# Patient Record
Sex: Female | Born: 1970 | Race: Black or African American | Hispanic: No | Marital: Single | Smoking: Never smoker
Health system: Southern US, Community
[De-identification: ages and names within clinical notes are randomized; demographics above are authoritative.]

## PROBLEM LIST (undated history)

## (undated) DIAGNOSIS — I10 Essential (primary) hypertension: Secondary | ICD-10-CM

## (undated) HISTORY — PX: HERNIA REPAIR: SHX51

---

## 2009-08-16 ENCOUNTER — Emergency Department (HOSPITAL_COMMUNITY): Admission: EM | Admit: 2009-08-16 | Discharge: 2009-08-16 | Payer: Self-pay | Admitting: Emergency Medicine

## 2009-08-18 ENCOUNTER — Emergency Department (HOSPITAL_COMMUNITY): Admission: EM | Admit: 2009-08-18 | Discharge: 2009-08-18 | Payer: Self-pay | Admitting: Family Medicine

## 2009-08-23 ENCOUNTER — Emergency Department (HOSPITAL_COMMUNITY): Admission: EM | Admit: 2009-08-23 | Discharge: 2009-08-23 | Payer: Self-pay | Admitting: Family Medicine

## 2013-07-15 ENCOUNTER — Encounter (HOSPITAL_COMMUNITY): Payer: Self-pay | Admitting: *Deleted

## 2013-07-15 ENCOUNTER — Emergency Department (HOSPITAL_COMMUNITY): Payer: BC Managed Care – PPO

## 2013-07-15 ENCOUNTER — Emergency Department (HOSPITAL_COMMUNITY)
Admission: EM | Admit: 2013-07-15 | Discharge: 2013-07-15 | Disposition: A | Discharge status: Home / Self Care | Payer: BC Managed Care – PPO | Attending: Emergency Medicine | Admitting: Emergency Medicine

## 2013-07-15 DIAGNOSIS — R0789 Other chest pain: Secondary | ICD-10-CM

## 2013-07-15 DIAGNOSIS — I10 Essential (primary) hypertension: Secondary | ICD-10-CM | POA: Insufficient documentation

## 2013-07-15 DIAGNOSIS — R071 Chest pain on breathing: Secondary | ICD-10-CM | POA: Insufficient documentation

## 2013-07-15 HISTORY — DX: Essential (primary) hypertension: I10

## 2013-07-15 LAB — POCT I-STAT, CHEM 8
Calcium, Ion: 1.2 mmol/L (ref 1.12–1.23)
Chloride: 105 mEq/L (ref 96–112)
Glucose, Bld: 153 mg/dL — ABNORMAL HIGH (ref 70–99)
HCT: 33 % — ABNORMAL LOW (ref 36.0–46.0)
Sodium: 139 mEq/L (ref 135–145)
TCO2: 25 mmol/L (ref 0–100)

## 2013-07-15 LAB — CBC
HCT: 31.7 % — ABNORMAL LOW (ref 36.0–46.0)
MCH: 24.1 pg — ABNORMAL LOW (ref 26.0–34.0)
MCHC: 32.5 g/dL (ref 30.0–36.0)
MCV: 74.1 fL — ABNORMAL LOW (ref 78.0–100.0)
Platelets: 250 10*3/uL (ref 150–400)
RDW: 16.6 % — ABNORMAL HIGH (ref 11.5–15.5)
WBC: 5.8 10*3/uL (ref 4.0–10.5)

## 2013-07-15 LAB — POCT I-STAT TROPONIN I: Troponin i, poc: 0 ng/mL (ref 0.00–0.08)

## 2013-07-15 NOTE — ED Provider Notes (Signed)
CSN: [id number]     Arrival date & time 07/15/13  2021 History   First MD Initiated Contact with Patient 07/15/13 2101     Chief Complaint  Patient presents with  . Anxiety  . Chest Pain   (Consider location/radiation/quality/duration/timing/severity/associated sxs/prior Treatment) Patient is a 42 y.o. female presenting with chest pain.  Chest Pain Chest pain location: left and right parasternal borders. Pain quality: aching (and squeezing)   Pain radiates to:  Does not radiate Pain radiates to the back: no   Pain severity:  Mild Onset quality:  Gradual Timing:  Intermittent Progression:  Resolved Chronicity:  New Context: at rest   Relieved by: position changes. Worsened by:  Movement and certain positions Ineffective treatments:  None tried Associated symptoms: no abdominal pain, no anorexia, no anxiety, no back pain, no cough, no fever, no headache, no nausea, no numbness, no orthopnea, no palpitations, no shortness of breath, not vomiting and no weakness   Risk factors: hypertension and obesity   Risk factors: no coronary artery disease, no diabetes mellitus, no high cholesterol, no prior DVT/PE, no smoking and no surgery     Past Medical History  Diagnosis Date  . Hypertension    History reviewed. No pertinent past surgical history. No family history on file. History  Substance Use Topics  . Smoking status: Never Smoker   . Smokeless tobacco: Not on file  . Alcohol Use: Not on file   OB History   Grav Para Term Preterm Abortions TAB SAB Ect Mult Living                 Review of Systems  Constitutional: Negative for fever.  HENT: Negative for congestion and sore throat.   Respiratory: Positive for chest tightness. Negative for cough and shortness of breath.   Cardiovascular: Positive for chest pain. Negative for palpitations and orthopnea.  Gastrointestinal: Negative for nausea, vomiting, abdominal pain, diarrhea, constipation and anorexia.  Musculoskeletal:  Negative for myalgias and back pain.  Skin: Negative for color change, pallor, rash and wound.  Neurological: Negative for weakness, numbness and headaches.  All other systems reviewed and are negative.    Allergies  Review of patient's allergies indicates no known allergies.  Home Medications  No current outpatient prescriptions on file. BP 135/89  Pulse 79  Temp(Src) 98.8 F (37.1 C) (Oral)  SpO2 98%  LMP 06/26/2013 Physical Exam  Nursing note and vitals reviewed. Constitutional: She is oriented to person, place, and time. She appears well-developed and well-nourished. No distress.  Pleasant, well appearing female in good spirits and NAD.  HENT:  Head: Normocephalic and atraumatic.  Mouth/Throat: Oropharynx is clear and moist. No oropharyngeal exudate.  Eyes: Conjunctivae and EOM are normal. Pupils are equal, round, and reactive to light.  Neck: Normal range of motion. Neck supple.  Cardiovascular: Normal rate, regular rhythm and normal heart sounds.  Exam reveals no gallop and no friction rub.   No murmur heard. Pulmonary/Chest: Effort normal and breath sounds normal. No respiratory distress. She has no wheezes. She has no rales. She exhibits no tenderness.  Abdominal: Soft. She exhibits no distension. There is no tenderness.  obese  Musculoskeletal: Normal range of motion. She exhibits no edema and no tenderness.  Lymphadenopathy:    She has no cervical adenopathy.  Neurological: She is alert and oriented to person, place, and time.  Skin: Skin is warm and dry. No rash noted. She is not diaphoretic.  Psychiatric: She has a normal mood and affect. Her  behavior is normal. Judgment and thought content normal.    ED Course  Procedures (including critical care time) Labs Review Labs Reviewed  CBC - Abnormal; Notable for the following:    Hemoglobin 10.3 (*)    HCT 31.7 (*)    MCV 74.1 (*)    MCH 24.1 (*)    RDW 16.6 (*)    All other components within normal limits   POCT I-STAT, CHEM 8 - Abnormal; Notable for the following:    Glucose, Bld 153 (*)    Hemoglobin 11.2 (*)    HCT 33.0 (*)    All other components within normal limits  POCT I-STAT TROPONIN I   Imaging Review Dg Chest 2 View  07/15/2013   *RADIOLOGY REPORT*  Clinical Data: Anxiety and chest pain.  CHEST - 2 VIEW  Comparison: None.  Findings: The lungs are well-aerated and clear.  There is no evidence of focal opacification, pleural effusion or pneumothorax.  The heart is normal in size; the mediastinal contour is within normal limits.  No acute osseous abnormalities are seen.  IMPRESSION: No acute cardiopulmonary process seen.   Original Report Authenticated By: Tonia Ghent, M.D.    MDM   1. Chest wall discomfort     Patient is a 42 year old female with a history of hypertension he presents with 2 episodes of mild chest pain or lightheadedness. Last night will she was laying in bed, she had some positional chest pain noted parasternally on both sides of her chest. It was mild in nature and only lasted less than a half hour. It waxed and waned with position and improved with certain movements and sitting up. Non-exertional. No lower extremity swelling or dyspnea. She had no associated fever, cough, shortness of breath, nausea, vomiting, weakness numbness, tingling. Today, while on the phone with her friend who is an EMT, she had similar episode with some associated anxiety.  On arrival patient is asymptomatic, pleasant, well-appearing female. She states no chest pain. Afebrile and hemodynamically stable. Chest x-ray without consolidation, effusion, pneumothorax, or widened mediastinum. EKG normal sinus rhythm as documented above. Initial troponin as well as basic labs obtained in triage are negative. Until this patient is low risk for ACS based on history as well as physical exam. TIMI 0 and HEART score 1. PERC negative and doubt PE. Low risk and atypical story for dissection. More likely is  costochondritis, anxiety. Will discharge home with course of anti-inflammatories as needed. Will need PCP followup for further management of her hypertension as well as further cardiac risk stratification. Based on very atypical history and low risk stratification, patient stable for discharge.    Date: 07/15/2013  Rate: 84  Rhythm: normal sinus rhythm  QRS Axis: normal  Intervals: normal  ST/T Wave abnormalities: normal  Conduction Disutrbances:none  Narrative Interpretation:   Old EKG Reviewed: none available  Discussed with the patient return precautions and need for follow up with PCP. Patient voiced understanding. Stable for d/c. This patient was discussed with my attending, Dr. Redgie Grayer.     Dorna Leitz, MD 07/15/13 [phone]

## 2013-07-15 NOTE — ED Notes (Signed)
MD at bedside. 

## 2013-07-15 NOTE — ED Notes (Signed)
pT STATES SHE HAD SOME cp LAST NIGHT WHILE TRYING TO SLEEP THAT WAS RELEIVED BY SITTING UP, AND NOT STATES SHE IS HAVING SOME CHEST TENDERNESS, BUT THINKS IT MIGHT BE RELATED TO ANXIETY MORE THAN cp

## 2013-07-16 NOTE — ED Provider Notes (Signed)
I saw and evaluated the patient, reviewed the resident's note and I agree with the findings and plan.  I have reviewed EKG and agree with resident interpretation.    Low risk CP.  ER w/u negative.    Darlys Gales, MD 07/16/13 [phone]

## 2015-02-14 IMAGING — CR DG CHEST 2V
2 series · 2 of 2 positions shown · non-contrast
Comparison: None.

CLINICAL DATA: Anxiety and chest pain.

CHEST - 2 VIEW

[w chest pa]
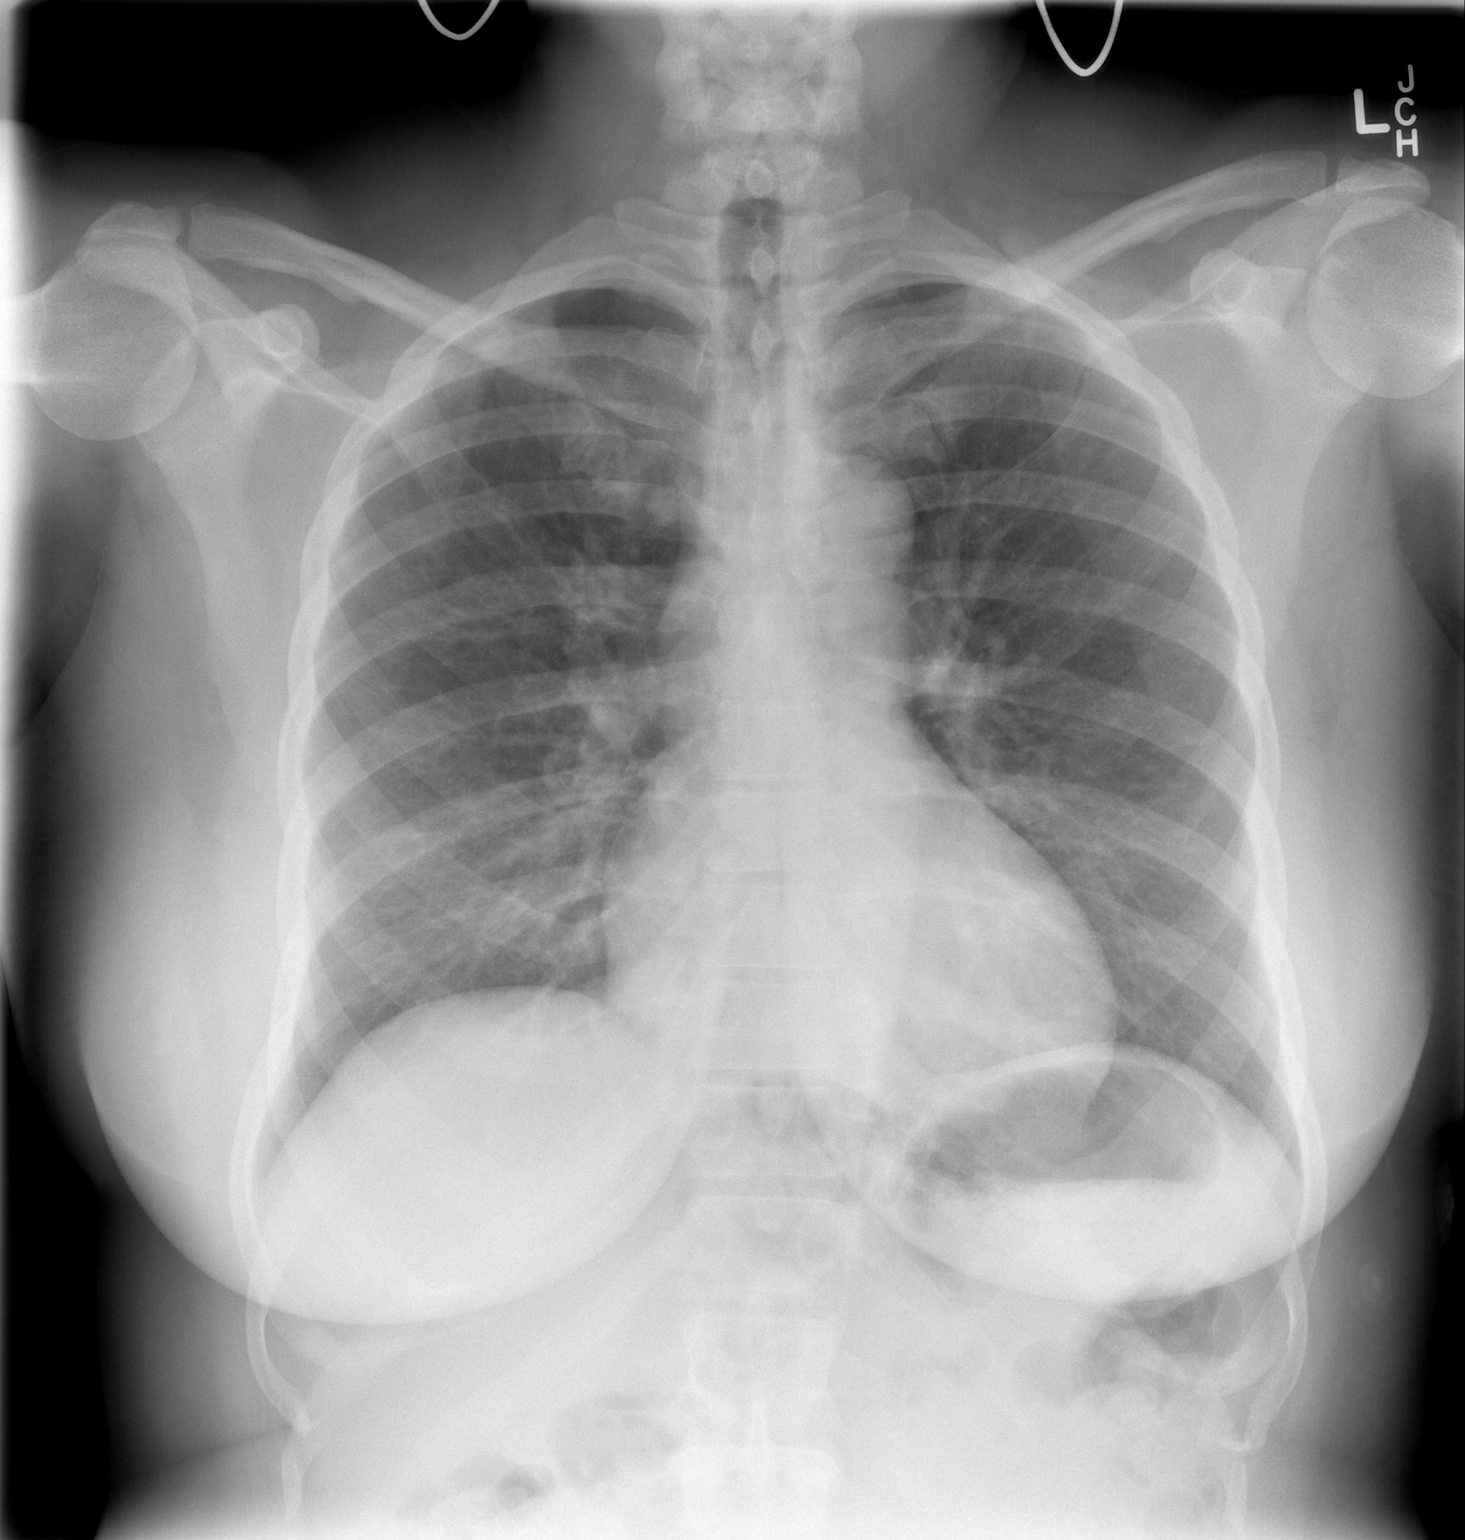

[w chest lat]
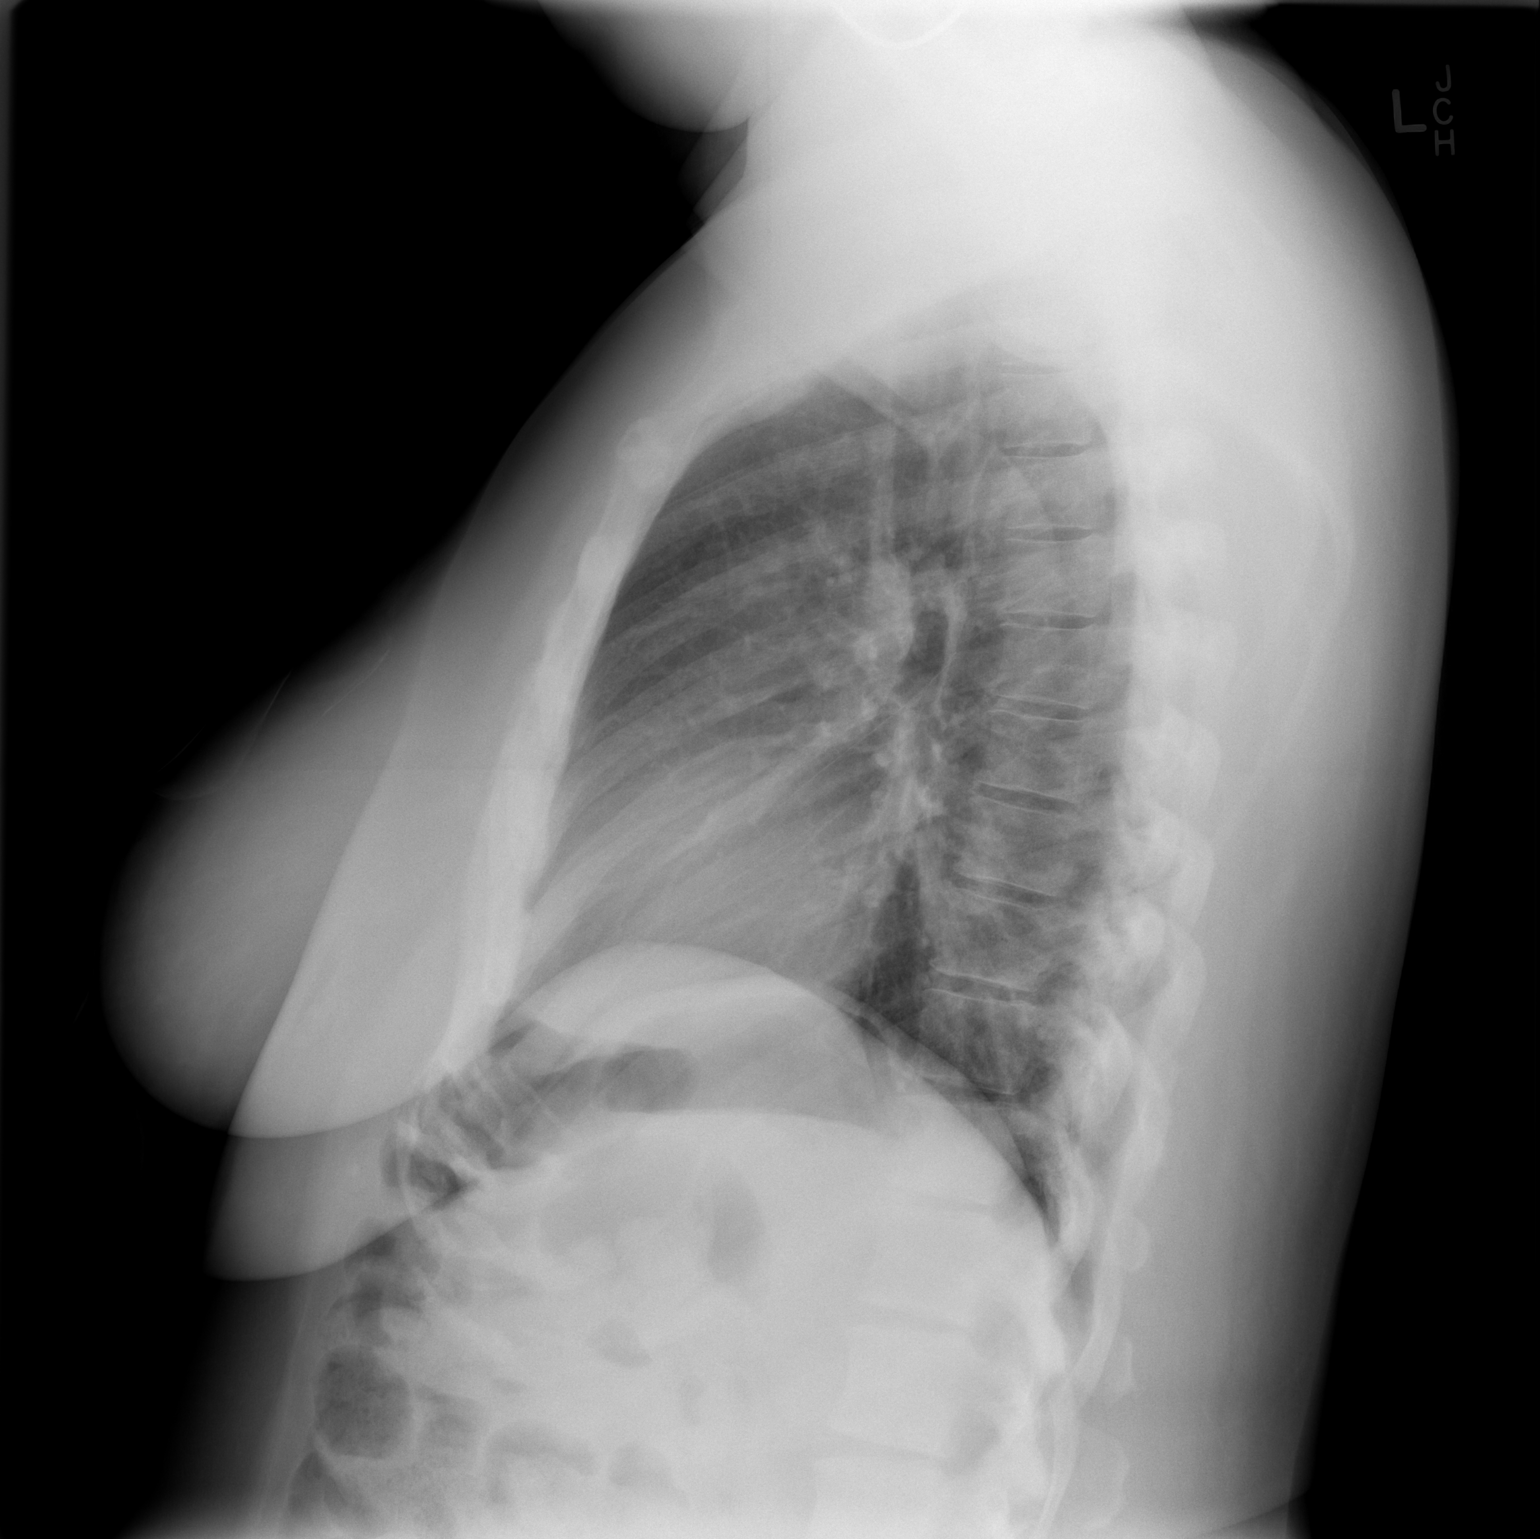

[2 of 2 positions shown; findings below may reference images not displayed]

FINDINGS: The lungs are well-aerated and clear.  There is no
evidence of focal opacification, pleural effusion or pneumothorax.

The heart is normal in size; the mediastinal contour is within
normal limits.  No acute osseous abnormalities are seen.
IMPRESSION: No acute cardiopulmonary process seen.

## 2022-09-05 ENCOUNTER — Ambulatory Visit (HOSPITAL_COMMUNITY)
Admission: EM | Admit: 2022-09-05 | Discharge: 2022-09-05 | Disposition: A | Discharge status: Home / Self Care | Payer: BLUE CROSS/BLUE SHIELD | Attending: Physician Assistant | Admitting: Physician Assistant

## 2022-09-05 ENCOUNTER — Encounter (HOSPITAL_COMMUNITY): Payer: Self-pay | Admitting: Emergency Medicine

## 2022-09-05 DIAGNOSIS — T7840XA Allergy, unspecified, initial encounter: Secondary | ICD-10-CM | POA: Diagnosis not present

## 2022-09-05 MED ORDER — METHYLPREDNISOLONE SODIUM SUCC 125 MG IJ SOLR
60.0000 mg | Freq: Once | INTRAMUSCULAR | Status: AC
  Administered 2022-09-05: 60 mg via INTRAMUSCULAR

## 2022-09-05 MED ORDER — METHYLPREDNISOLONE SODIUM SUCC 125 MG IJ SOLR
INTRAMUSCULAR | Status: AC
  Filled 2022-09-05: qty 2

## 2022-09-05 NOTE — ED Provider Notes (Signed)
[facility]    CSN: [id number] Arrival date & time: 09/05/22  1646      History   Chief Complaint Chief Complaint  Patient presents with   Urticaria   Allergic Reaction    HPI Kathryn Russell is a 51 y.o. female.   Pt reports earlier today after eating springs rolls with shrimp salmon, peanut sauce she started today experience hives, scratchy throat.  She reports she has eaten these foods in the past without issues.  She has no known allergies.  She reports taking benadryl, rash has now subsided.  Denies shortness of breath or trouble breathing or swallowing.  She feels like her voice is hoarse.     Past Medical History:  Diagnosis Date   Hypertension     There are no problems to display for this patient.   Past Surgical History:  Procedure Laterality Date   HERNIA REPAIR      OB History   No obstetric history on file.      Home Medications    Prior to Admission medications   Not on File    Family History No family history on file.  Social History Social History   Tobacco Use   Smoking status: Never     Allergies   Patient has no known allergies.   Review of Systems Review of Systems  Constitutional:  Negative for chills and fever.  HENT:  Positive for voice change. Negative for ear pain and sore throat.   Eyes:  Negative for pain and visual disturbance.  Respiratory:  Negative for cough and shortness of breath.   Cardiovascular:  Negative for chest pain and palpitations.  Gastrointestinal:  Negative for abdominal pain and vomiting.  Genitourinary:  Negative for dysuria and hematuria.  Musculoskeletal:  Negative for arthralgias and back pain.  Skin:  Positive for rash. Negative for color change.  Neurological:  Negative for seizures and syncope.  All other systems reviewed and are negative.    Physical Exam Triage Vital Signs ED Triage Vitals  Enc Vitals Group     BP 09/05/22 1650 (!) 189/132     Pulse Rate 09/05/22  1650 (!) 105     Resp 09/05/22 1650 18     Temp --      Temp src --      SpO2 09/05/22 1650 99 %     Weight --      Height --      Head Circumference --      Peak Flow --      Pain Score 09/05/22 1653 0     Pain Loc --      Pain Edu? --      Excl. in GC? --    No data found.  Updated Vital Signs BP (!) 184/97 (BP Location: Right Arm)   Pulse (!) 105   Resp 18   SpO2 99%   Visual Acuity Right Eye Distance:   Left Eye Distance:   Bilateral Distance:    Right Eye Near:   Left Eye Near:    Bilateral Near:     Physical Exam Vitals and nursing note reviewed.  Constitutional:      General: She is not in acute distress.    Appearance: She is well-developed.  HENT:     Head: Normocephalic and atraumatic.  Eyes:     Conjunctiva/sclera: Conjunctivae normal.  Cardiovascular:     Rate and Rhythm: Normal rate and regular rhythm.     Heart sounds:  No murmur heard. Pulmonary:     Effort: Pulmonary effort is normal. No respiratory distress.     Breath sounds: Normal breath sounds.  Abdominal:     Palpations: Abdomen is soft.     Tenderness: There is no abdominal tenderness.  Musculoskeletal:        General: No swelling.     Cervical back: Neck supple.  Skin:    General: Skin is warm and dry.     Capillary Refill: Capillary refill takes less than 2 seconds.  Neurological:     Mental Status: She is alert.  Psychiatric:        Mood and Affect: Mood normal.      UC Treatments / Results  Labs (all labs ordered are listed, but only abnormal results are displayed) Labs Reviewed - No data to display  EKG   Radiology No results found.  Procedures Procedures (including critical care time)  Medications Ordered in UC Medications  methylPREDNISolone sodium succinate (SOLU-MEDROL) 125 mg/2 mL injection 60 mg (has no administration in time range)    Initial Impression / Assessment and Plan / UC Course  I have reviewed the triage vital signs and the nursing  notes.  Pertinent labs & imaging results that were available during my care of the patient were reviewed by me and considered in my medical decision making (see chart for details).     Possible allergic reaction to unknown substance. At this time no rash appreciated, Normal HENT exam, lungs clear.  Vitals normal. Pt stable for discharge.  She will continue with benadryl as needed.  Strict ED precautions given.  Final Clinical Impressions(s) / UC Diagnoses   Final diagnoses:  Allergic reaction, initial encounter     Discharge Instructions      Continue with Benadryl as needed If you develop new or worsening symptoms recommend evaluation in the Emergency Department    ED Prescriptions   None    PDMP not reviewed this encounter.   Ward, Tylene Fantasia, PA-C 09/05/22 1752

## 2022-09-05 NOTE — Discharge Instructions (Addendum)
Continue with Benadryl as needed If you develop new or worsening symptoms recommend evaluation in the Emergency Department

## 2022-09-05 NOTE — ED Triage Notes (Signed)
Pt reports took sweet potato pie out of oven and reports felt throat close up, broke out in hives. Took Benadryl 50mg  about 30 minutes ago. 2 hours ago ate spring rolls that patient made herself with fish, shrimp, rice paper had peanut sauce but never had issues before.    Pt reports bottom lip is tingling and throat is scratchy and hoarse voice but doesn't feel like closing anymore.
